# Patient Record
Sex: Male | Born: 1995 | Race: White | Hispanic: No | Marital: Single | Smoking: Never smoker
Health system: Southern US, Community
[De-identification: ages and names within clinical notes are randomized; demographics above are authoritative.]

## PROBLEM LIST (undated history)

## (undated) DIAGNOSIS — F32A Depression, unspecified: Secondary | ICD-10-CM

## (undated) DIAGNOSIS — F329 Major depressive disorder, single episode, unspecified: Secondary | ICD-10-CM

## (undated) HISTORY — PX: APPENDECTOMY: SHX54

## (undated) HISTORY — PX: TONSILLECTOMY: SUR1361

## (undated) HISTORY — PX: WISDOM TOOTH EXTRACTION: SHX21

---
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
## 2014-03-16 ENCOUNTER — Institutional Professional Consult (permissible substitution): Payer: BC Managed Care – PPO | Admitting: Pediatrics

## 2014-03-16 DIAGNOSIS — F909 Attention-deficit hyperactivity disorder, unspecified type: Secondary | ICD-10-CM

## 2014-03-16 DIAGNOSIS — R279 Unspecified lack of coordination: Secondary | ICD-10-CM

## 2014-03-16 DIAGNOSIS — R6250 Unspecified lack of expected normal physiological development in childhood: Secondary | ICD-10-CM

## 2014-06-14 ENCOUNTER — Institutional Professional Consult (permissible substitution): Payer: BC Managed Care – PPO | Admitting: Pediatrics

## 2014-06-14 DIAGNOSIS — R279 Unspecified lack of coordination: Secondary | ICD-10-CM

## 2014-06-14 DIAGNOSIS — F411 Generalized anxiety disorder: Secondary | ICD-10-CM

## 2014-06-14 DIAGNOSIS — F909 Attention-deficit hyperactivity disorder, unspecified type: Secondary | ICD-10-CM

## 2014-08-29 ENCOUNTER — Institutional Professional Consult (permissible substitution): Payer: BC Managed Care – PPO | Admitting: Pediatrics

## 2014-08-29 DIAGNOSIS — F82 Specific developmental disorder of motor function: Secondary | ICD-10-CM

## 2014-08-29 DIAGNOSIS — F902 Attention-deficit hyperactivity disorder, combined type: Secondary | ICD-10-CM

## 2014-10-04 ENCOUNTER — Institutional Professional Consult (permissible substitution): Payer: BC Managed Care – PPO | Admitting: Pediatrics

## 2014-10-04 DIAGNOSIS — F902 Attention-deficit hyperactivity disorder, combined type: Secondary | ICD-10-CM

## 2014-10-04 DIAGNOSIS — F82 Specific developmental disorder of motor function: Secondary | ICD-10-CM

## 2015-08-30 ENCOUNTER — Emergency Department (HOSPITAL_COMMUNITY): Payer: BLUE CROSS/BLUE SHIELD

## 2015-08-30 ENCOUNTER — Encounter (HOSPITAL_COMMUNITY): Payer: Self-pay | Admitting: Emergency Medicine

## 2015-08-30 DIAGNOSIS — Z79899 Other long term (current) drug therapy: Secondary | ICD-10-CM | POA: Insufficient documentation

## 2015-08-30 DIAGNOSIS — S060X9A Concussion with loss of consciousness of unspecified duration, initial encounter: Secondary | ICD-10-CM | POA: Diagnosis not present

## 2015-08-30 DIAGNOSIS — Y9389 Activity, other specified: Secondary | ICD-10-CM | POA: Insufficient documentation

## 2015-08-30 DIAGNOSIS — S199XXA Unspecified injury of neck, initial encounter: Secondary | ICD-10-CM | POA: Insufficient documentation

## 2015-08-30 DIAGNOSIS — Z8659 Personal history of other mental and behavioral disorders: Secondary | ICD-10-CM | POA: Insufficient documentation

## 2015-08-30 DIAGNOSIS — S0990XA Unspecified injury of head, initial encounter: Secondary | ICD-10-CM | POA: Diagnosis present

## 2015-08-30 DIAGNOSIS — Y999 Unspecified external cause status: Secondary | ICD-10-CM | POA: Insufficient documentation

## 2015-08-30 DIAGNOSIS — Y92410 Unspecified street and highway as the place of occurrence of the external cause: Secondary | ICD-10-CM | POA: Diagnosis not present
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
## 2015-08-31 ENCOUNTER — Emergency Department (HOSPITAL_COMMUNITY)
Admission: EM | Admit: 2015-08-31 | Discharge: 2015-08-31 | Disposition: A | Discharge status: Home / Self Care | Payer: BLUE CROSS/BLUE SHIELD | Attending: Emergency Medicine | Admitting: Emergency Medicine

## 2015-08-31 DIAGNOSIS — S0990XA Unspecified injury of head, initial encounter: Secondary | ICD-10-CM

## 2015-08-31 DIAGNOSIS — S060X9A Concussion with loss of consciousness of unspecified duration, initial encounter: Secondary | ICD-10-CM

## 2015-08-31 HISTORY — DX: Major depressive disorder, single episode, unspecified: F32.9

## 2015-08-31 HISTORY — DX: Depression, unspecified: F32.A
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
## 2015-08-31 NOTE — ED Notes (Signed)
MD to see and assess patient before RN assessment.
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
## 2016-06-10 DIAGNOSIS — F331 Major depressive disorder, recurrent, moderate: Secondary | ICD-10-CM | POA: Diagnosis not present

## 2016-06-17 DIAGNOSIS — F331 Major depressive disorder, recurrent, moderate: Secondary | ICD-10-CM | POA: Diagnosis not present

## 2016-07-04 DIAGNOSIS — F331 Major depressive disorder, recurrent, moderate: Secondary | ICD-10-CM | POA: Diagnosis not present

## 2016-07-25 DIAGNOSIS — F331 Major depressive disorder, recurrent, moderate: Secondary | ICD-10-CM | POA: Diagnosis not present

## 2016-08-05 DIAGNOSIS — G8929 Other chronic pain: Secondary | ICD-10-CM | POA: Diagnosis not present

## 2016-08-05 DIAGNOSIS — M25512 Pain in left shoulder: Secondary | ICD-10-CM | POA: Diagnosis not present

## 2016-08-16 DIAGNOSIS — G8929 Other chronic pain: Secondary | ICD-10-CM | POA: Diagnosis not present

## 2016-08-16 DIAGNOSIS — M25512 Pain in left shoulder: Secondary | ICD-10-CM | POA: Diagnosis not present

## 2016-08-26 DIAGNOSIS — S43432A Superior glenoid labrum lesion of left shoulder, initial encounter: Secondary | ICD-10-CM | POA: Diagnosis not present

## 2016-09-30 DIAGNOSIS — F3132 Bipolar disorder, current episode depressed, moderate: Secondary | ICD-10-CM | POA: Diagnosis not present

## 2016-11-07 DIAGNOSIS — L42 Pityriasis rosea: Secondary | ICD-10-CM | POA: Diagnosis not present

## 2016-11-07 DIAGNOSIS — L400 Psoriasis vulgaris: Secondary | ICD-10-CM | POA: Diagnosis not present

## 2016-11-07 DIAGNOSIS — L218 Other seborrheic dermatitis: Secondary | ICD-10-CM | POA: Diagnosis not present

## 2017-01-21 DIAGNOSIS — F3181 Bipolar II disorder: Secondary | ICD-10-CM | POA: Diagnosis not present

## 2017-01-27 DIAGNOSIS — F3181 Bipolar II disorder: Secondary | ICD-10-CM | POA: Diagnosis not present

## 2017-02-02 DIAGNOSIS — F3132 Bipolar disorder, current episode depressed, moderate: Secondary | ICD-10-CM | POA: Diagnosis not present

## 2017-02-02 DIAGNOSIS — Z79899 Other long term (current) drug therapy: Secondary | ICD-10-CM | POA: Diagnosis not present

## 2017-02-03 DIAGNOSIS — F3181 Bipolar II disorder: Secondary | ICD-10-CM | POA: Diagnosis not present

## 2017-02-10 DIAGNOSIS — F3181 Bipolar II disorder: Secondary | ICD-10-CM | POA: Diagnosis not present
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
## 2017-02-17 DIAGNOSIS — F3181 Bipolar II disorder: Secondary | ICD-10-CM | POA: Diagnosis not present

## 2017-02-24 DIAGNOSIS — F3181 Bipolar II disorder: Secondary | ICD-10-CM | POA: Diagnosis not present

## 2017-03-10 DIAGNOSIS — F3181 Bipolar II disorder: Secondary | ICD-10-CM | POA: Diagnosis not present

## 2017-03-18 DIAGNOSIS — F3181 Bipolar II disorder: Secondary | ICD-10-CM | POA: Diagnosis not present

## 2017-04-07 DIAGNOSIS — F3181 Bipolar II disorder: Secondary | ICD-10-CM | POA: Diagnosis not present

## 2018-02-16 ENCOUNTER — Encounter (HOSPITAL_COMMUNITY): Admission: RE | Payer: Self-pay | Source: Ambulatory Visit

## 2018-02-16 ENCOUNTER — Ambulatory Visit (HOSPITAL_COMMUNITY)
Admission: RE | Admit: 2018-02-16 | Payer: BLUE CROSS/BLUE SHIELD | Source: Ambulatory Visit | Admitting: Orthopedic Surgery

## 2018-02-16 SURGERY — REPAIR, TENDON, BICEPS, DISTAL
Anesthesia: General | Laterality: Left

## 2018-05-18 DIAGNOSIS — L409 Psoriasis, unspecified: Secondary | ICD-10-CM | POA: Diagnosis not present

## 2018-05-18 DIAGNOSIS — F3181 Bipolar II disorder: Secondary | ICD-10-CM | POA: Diagnosis not present

## 2018-05-18 DIAGNOSIS — R002 Palpitations: Secondary | ICD-10-CM | POA: Diagnosis not present

## 2018-05-27 DIAGNOSIS — L400 Psoriasis vulgaris: Secondary | ICD-10-CM | POA: Diagnosis not present

## 2018-06-15 DIAGNOSIS — L0591 Pilonidal cyst without abscess: Secondary | ICD-10-CM | POA: Diagnosis not present

## 2018-06-24 DIAGNOSIS — L0591 Pilonidal cyst without abscess: Secondary | ICD-10-CM | POA: Diagnosis not present

## 2018-06-30 DIAGNOSIS — I471 Supraventricular tachycardia: Secondary | ICD-10-CM | POA: Diagnosis not present

## 2018-07-19 DIAGNOSIS — L0591 Pilonidal cyst without abscess: Secondary | ICD-10-CM | POA: Diagnosis not present

## 2018-07-19 DIAGNOSIS — L0501 Pilonidal cyst with abscess: Secondary | ICD-10-CM | POA: Diagnosis not present

## 2018-07-19 DIAGNOSIS — F319 Bipolar disorder, unspecified: Secondary | ICD-10-CM | POA: Diagnosis not present

## 2018-07-19 DIAGNOSIS — L732 Hidradenitis suppurativa: Secondary | ICD-10-CM | POA: Diagnosis not present

## 2018-08-04 DIAGNOSIS — Z23 Encounter for immunization: Secondary | ICD-10-CM | POA: Diagnosis not present

## 2018-08-04 DIAGNOSIS — Z111 Encounter for screening for respiratory tuberculosis: Secondary | ICD-10-CM | POA: Diagnosis not present

## 2018-10-21 DIAGNOSIS — Z111 Encounter for screening for respiratory tuberculosis: Secondary | ICD-10-CM | POA: Diagnosis not present

## 2018-10-24 DIAGNOSIS — Z111 Encounter for screening for respiratory tuberculosis: Secondary | ICD-10-CM | POA: Diagnosis not present

## 2018-11-01 DIAGNOSIS — R631 Polydipsia: Secondary | ICD-10-CM | POA: Diagnosis not present

## 2018-11-01 DIAGNOSIS — G4710 Hypersomnia, unspecified: Secondary | ICD-10-CM | POA: Diagnosis not present

## 2018-12-18 DIAGNOSIS — Z79899 Other long term (current) drug therapy: Secondary | ICD-10-CM | POA: Diagnosis not present

## 2018-12-18 DIAGNOSIS — R631 Polydipsia: Secondary | ICD-10-CM | POA: Diagnosis not present

## 2019-02-21 DIAGNOSIS — N251 Nephrogenic diabetes insipidus: Secondary | ICD-10-CM | POA: Diagnosis not present

## 2019-02-21 DIAGNOSIS — F319 Bipolar disorder, unspecified: Secondary | ICD-10-CM | POA: Diagnosis not present

## 2019-06-21 DIAGNOSIS — Z79899 Other long term (current) drug therapy: Secondary | ICD-10-CM | POA: Diagnosis not present

## 2019-06-21 DIAGNOSIS — F3132 Bipolar disorder, current episode depressed, moderate: Secondary | ICD-10-CM | POA: Diagnosis not present

## 2019-07-13 DIAGNOSIS — L408 Other psoriasis: Secondary | ICD-10-CM | POA: Diagnosis not present

## 2019-07-13 DIAGNOSIS — R21 Rash and other nonspecific skin eruption: Secondary | ICD-10-CM | POA: Diagnosis not present

## 2019-07-13 DIAGNOSIS — L0591 Pilonidal cyst without abscess: Secondary | ICD-10-CM | POA: Diagnosis not present

## 2019-10-12 DIAGNOSIS — Z20828 Contact with and (suspected) exposure to other viral communicable diseases: Secondary | ICD-10-CM | POA: Diagnosis not present

## 2019-11-16 DIAGNOSIS — L739 Follicular disorder, unspecified: Secondary | ICD-10-CM | POA: Diagnosis not present

## 2019-11-16 DIAGNOSIS — L0292 Furuncle, unspecified: Secondary | ICD-10-CM | POA: Diagnosis not present

## 2019-11-16 DIAGNOSIS — L409 Psoriasis, unspecified: Secondary | ICD-10-CM | POA: Diagnosis not present

## 2020-03-14 DIAGNOSIS — Z6828 Body mass index (BMI) 28.0-28.9, adult: Secondary | ICD-10-CM | POA: Diagnosis not present

## 2020-03-14 DIAGNOSIS — H6122 Impacted cerumen, left ear: Secondary | ICD-10-CM | POA: Diagnosis not present

## 2020-03-14 DIAGNOSIS — H7291 Unspecified perforation of tympanic membrane, right ear: Secondary | ICD-10-CM | POA: Diagnosis not present

## 2020-07-02 DIAGNOSIS — Z0000 Encounter for general adult medical examination without abnormal findings: Secondary | ICD-10-CM | POA: Diagnosis not present

## 2020-07-02 DIAGNOSIS — E663 Overweight: Secondary | ICD-10-CM | POA: Diagnosis not present

## 2020-07-02 DIAGNOSIS — Z833 Family history of diabetes mellitus: Secondary | ICD-10-CM | POA: Diagnosis not present

## 2020-07-31 DIAGNOSIS — E663 Overweight: Secondary | ICD-10-CM | POA: Diagnosis not present

## 2020-07-31 DIAGNOSIS — Z833 Family history of diabetes mellitus: Secondary | ICD-10-CM | POA: Diagnosis not present

## 2021-10-21 ENCOUNTER — Other Ambulatory Visit (HOSPITAL_COMMUNITY): Payer: Self-pay | Admitting: Urology

## 2021-10-21 DIAGNOSIS — C61 Malignant neoplasm of prostate: Secondary | ICD-10-CM
